# Patient Record
Sex: Male | Born: 1985 | Race: White | Hispanic: No | Marital: Married | Smoking: Current every day smoker
Health system: Southern US, Community
[De-identification: ages and names within clinical notes are randomized; demographics above are authoritative.]

---
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
## 2010-07-01 ENCOUNTER — Emergency Department (HOSPITAL_COMMUNITY)
Admission: EM | Admit: 2010-07-01 | Discharge: 2010-07-01 | Payer: Self-pay | Source: Home / Self Care | Admitting: Emergency Medicine

## 2010-07-23 ENCOUNTER — Emergency Department (HOSPITAL_COMMUNITY): Admission: EM | Admit: 2010-07-23 | Discharge: 2010-07-23 | Payer: Self-pay | Admitting: Emergency Medicine

## 2011-01-09 LAB — URINALYSIS, ROUTINE W REFLEX MICROSCOPIC
Bilirubin Urine: NEGATIVE
Glucose, UA: NEGATIVE mg/dL
Hgb urine dipstick: NEGATIVE
Ketones, ur: NEGATIVE mg/dL
Nitrite: NEGATIVE
Protein, ur: NEGATIVE mg/dL
Specific Gravity, Urine: 1.007 (ref 1.005–1.030)
Urobilinogen, UA: 0.2 mg/dL (ref 0.0–1.0)
pH: 5.5 (ref 5.0–8.0)

## 2011-01-09 LAB — RAPID URINE DRUG SCREEN, HOSP PERFORMED
Amphetamines: NOT DETECTED
Barbiturates: NOT DETECTED
Benzodiazepines: NOT DETECTED
Cocaine: NOT DETECTED
Opiates: NOT DETECTED
Tetrahydrocannabinol: NOT DETECTED

## 2011-01-09 LAB — CBC
MCH: 32.1 pg (ref 26.0–34.0)
MCHC: 34.8 g/dL (ref 30.0–36.0)
Platelets: 208 10*3/uL (ref 150–400)
RDW: 12.5 % (ref 11.5–15.5)

## 2011-01-09 LAB — DIFFERENTIAL
Basophils Absolute: 0 10*3/uL (ref 0.0–0.1)
Basophils Relative: 0 % (ref 0–1)
Eosinophils Absolute: 0 10*3/uL (ref 0.0–0.7)
Monocytes Relative: 5 % (ref 3–12)
Neutro Abs: 11.5 10*3/uL — ABNORMAL HIGH (ref 1.7–7.7)
Neutrophils Relative %: 86 % — ABNORMAL HIGH (ref 43–77)

## 2011-01-09 LAB — POCT I-STAT, CHEM 8
Calcium, Ion: 1.08 mmol/L — ABNORMAL LOW (ref 1.12–1.32)
Chloride: 97 mEq/L (ref 96–112)
Glucose, Bld: 92 mg/dL (ref 70–99)
HCT: 40 % (ref 39.0–52.0)
TCO2: 22 mmol/L (ref 0–100)

## 2011-01-09 LAB — GLUCOSE, CAPILLARY: Glucose-Capillary: 99 mg/dL (ref 70–99)

## 2011-01-09 LAB — ETHANOL: Alcohol, Ethyl (B): <5 mg/dL (ref 0–10)

## 2011-01-09 LAB — PROTIME-INR
INR: 1.09 (ref 0.00–1.49)
Prothrombin Time: 14.3 seconds (ref 11.6–15.2)

## 2011-01-21 ENCOUNTER — Emergency Department (HOSPITAL_COMMUNITY)
Admission: EM | Admit: 2011-01-21 | Discharge: 2011-01-21 | Disposition: A | Discharge status: Home / Self Care | Payer: BC Managed Care – PPO | Attending: Emergency Medicine | Admitting: Emergency Medicine

## 2011-01-21 DIAGNOSIS — Z202 Contact with and (suspected) exposure to infections with a predominantly sexual mode of transmission: Secondary | ICD-10-CM | POA: Insufficient documentation
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
## 2013-02-21 ENCOUNTER — Ambulatory Visit: Payer: Self-pay

## 2016-12-09 DIAGNOSIS — J111 Influenza due to unidentified influenza virus with other respiratory manifestations: Secondary | ICD-10-CM | POA: Diagnosis not present

## 2016-12-09 DIAGNOSIS — F17200 Nicotine dependence, unspecified, uncomplicated: Secondary | ICD-10-CM | POA: Diagnosis not present

## 2018-04-21 ENCOUNTER — Emergency Department (HOSPITAL_COMMUNITY): Payer: BLUE CROSS/BLUE SHIELD

## 2018-04-21 ENCOUNTER — Encounter (HOSPITAL_COMMUNITY): Payer: Self-pay

## 2018-04-21 ENCOUNTER — Emergency Department (HOSPITAL_COMMUNITY)
Admission: EM | Admit: 2018-04-21 | Discharge: 2018-04-21 | Disposition: A | Discharge status: Home / Self Care | Payer: BLUE CROSS/BLUE SHIELD | Attending: Emergency Medicine | Admitting: Emergency Medicine

## 2018-04-21 DIAGNOSIS — F17200 Nicotine dependence, unspecified, uncomplicated: Secondary | ICD-10-CM | POA: Diagnosis not present

## 2018-04-21 DIAGNOSIS — N201 Calculus of ureter: Secondary | ICD-10-CM | POA: Insufficient documentation

## 2018-04-21 DIAGNOSIS — R109 Unspecified abdominal pain: Secondary | ICD-10-CM

## 2018-04-21 DIAGNOSIS — R1012 Left upper quadrant pain: Secondary | ICD-10-CM | POA: Diagnosis present

## 2018-04-21 LAB — CBC WITH DIFFERENTIAL/PLATELET
Basophils Absolute: 0 10*3/uL (ref 0.0–0.1)
Basophils Relative: 0 %
Eosinophils Absolute: 0.1 10*3/uL (ref 0.0–0.7)
Eosinophils Relative: 1 %
HEMATOCRIT: 43.1 % (ref 39.0–52.0)
HEMOGLOBIN: 14.9 g/dL (ref 13.0–17.0)
LYMPHS PCT: 34 %
Lymphs Abs: 3 10*3/uL (ref 0.7–4.0)
MCH: 32.2 pg (ref 26.0–34.0)
MCHC: 34.6 g/dL (ref 30.0–36.0)
MCV: 93.1 fL (ref 78.0–100.0)
MONOS PCT: 7 %
Monocytes Absolute: 0.6 10*3/uL (ref 0.1–1.0)
NEUTROS ABS: 5.2 10*3/uL (ref 1.7–7.7)
NEUTROS PCT: 58 %
Platelets: 284 10*3/uL (ref 150–400)
RBC: 4.63 MIL/uL (ref 4.22–5.81)
RDW: 12.9 % (ref 11.5–15.5)
WBC: 8.8 10*3/uL (ref 4.0–10.5)

## 2018-04-21 LAB — COMPREHENSIVE METABOLIC PANEL
ALK PHOS: 68 U/L (ref 38–126)
ALT: 15 U/L (ref 0–44)
ANION GAP: 12 (ref 5–15)
AST: 20 U/L (ref 15–41)
Albumin: 4.5 g/dL (ref 3.5–5.0)
BILIRUBIN TOTAL: 0.2 mg/dL — AB (ref 0.3–1.2)
BUN: 19 mg/dL (ref 6–20)
CALCIUM: 9.3 mg/dL (ref 8.9–10.3)
CO2: 22 mmol/L (ref 22–32)
CREATININE: 1.1 mg/dL (ref 0.61–1.24)
Chloride: 105 mmol/L (ref 98–111)
GFR calc non Af Amer: >60 mL/min (ref >60)
GLUCOSE: 127 mg/dL — AB (ref 70–99)
Potassium: 3.4 mmol/L — ABNORMAL LOW (ref 3.5–5.1)
Sodium: 139 mmol/L (ref 135–145)
TOTAL PROTEIN: 7.7 g/dL (ref 6.5–8.1)

## 2018-04-21 LAB — URINALYSIS, ROUTINE W REFLEX MICROSCOPIC
BACTERIA UA: NONE SEEN
BILIRUBIN URINE: NEGATIVE
Glucose, UA: NEGATIVE mg/dL
Ketones, ur: NEGATIVE mg/dL
Leukocytes, UA: NEGATIVE
NITRITE: NEGATIVE
PH: 6 (ref 5.0–8.0)
Protein, ur: NEGATIVE mg/dL
Specific Gravity, Urine: 1.018 (ref 1.005–1.030)

## 2018-04-21 LAB — LIPASE, BLOOD: Lipase: 27 U/L (ref 11–51)

## 2018-04-21 MED ORDER — TAMSULOSIN HCL 0.4 MG PO CAPS: 0.4000 mg | ORAL_CAPSULE | Freq: Every day | ORAL | 0 refills | Status: AC

## 2018-04-21 MED ORDER — IBUPROFEN 800 MG PO TABS: 800.0000 mg | ORAL_TABLET | Freq: Three times a day (TID) | ORAL | 0 refills | Status: AC | PRN

## 2018-04-21 MED ORDER — KETOROLAC TROMETHAMINE 30 MG/ML IJ SOLN
30.0000 mg | Freq: Once | INTRAMUSCULAR | Status: AC
  Administered 2018-04-21: 30 mg via INTRAVENOUS
  Filled 2018-04-21: qty 1

## 2018-04-21 MED ORDER — OXYCODONE-ACETAMINOPHEN 5-325 MG PO TABS: 1.0000 | ORAL_TABLET | ORAL | 0 refills | Status: DC | PRN

## 2018-04-21 MED ORDER — ONDANSETRON 4 MG PO TBDP: 4.0000 mg | ORAL_TABLET | Freq: Three times a day (TID) | ORAL | 0 refills | Status: AC | PRN

## 2018-04-21 MED ORDER — ONDANSETRON HCL 4 MG/2ML IJ SOLN
4.0000 mg | Freq: Once | INTRAMUSCULAR | Status: AC
  Administered 2018-04-21: 4 mg via INTRAVENOUS
  Filled 2018-04-21: qty 2

## 2018-04-21 NOTE — Discharge Instructions (Signed)
You have been seen in the Emergency Department (ED) today for pain that we believe based on your workup, is caused by kidney stones.  As we have discussed, please drink plenty of fluids.  Please make a follow up appointment with the physician(s) listed elsewhere in this documentation. ° °You may take pain medication as needed but ONLY as prescribed.  Please also take your prescribed Flomax daily.  We also recommend that you take over-the-counter ibuprofen regularly according to label instructions over the next 5 days.  Take it with meals to minimize stomach discomfort. ° °Please see your doctor as soon as possible as stones may take 1-3 weeks to pass and you may require additional care or medications. ° °Do not drink alcohol, drive or participate in any other potentially dangerous activities while taking opiate pain medication as it may make you sleepy. Do not take this medication with any other sedating medications, either prescription or over-the-counter. If you were prescribed Percocet or Vicodin, do not take these with acetaminophen (Tylenol) as it is already contained within these medications. °  °Take Percocet as needed for severe pain.  This medication is an opiate (or narcotic) pain medication and can be habit forming.  Use it as little as possible to achieve adequate pain control.  Do not use or use it with extreme caution if you have a history of opiate abuse or dependence.  If you are on a pain contract with your primary care doctor or a pain specialist, be sure to let them know you were prescribed this medication today from the Emergency Department.  This medication is intended for your use only - do not give any to anyone else and keep it in a secure place where nobody else, especially children, have access to it.  It will also cause or worsen constipation, so you may want to consider taking an over-the-counter stool softener while you are taking this medication. ° °Return to the Emergency Department  (ED) or call your doctor if you have any worsening pain, fever, painful urination, are unable to urinate, or develop other symptoms that concern you. ° ° ° °Kidney Stones °Kidney stones (urolithiasis) are deposits that form inside your kidneys. The intense pain is caused by the stone moving through the urinary tract. When the stone moves, the ureter goes into spasm around the stone. The stone is usually passed in the urine.  °CAUSES  °A disorder that makes certain neck glands produce too much parathyroid hormone (primary hyperparathyroidism). °A buildup of uric acid crystals, similar to gout in your joints. °Narrowing (stricture) of the ureter. °A kidney obstruction present at birth (congenital obstruction). °Previous surgery on the kidney or ureters. °Numerous kidney infections. °SYMPTOMS  °Feeling sick to your stomach (nauseous). °Throwing up (vomiting). °Blood in the urine (hematuria). °Pain that usually spreads (radiates) to the groin. °Frequency or urgency of urination. °DIAGNOSIS  °Taking a history and physical exam. °Blood or urine tests. °CT scan. °Occasionally, an examination of the inside of the urinary bladder (cystoscopy) is performed. °TREATMENT  °Observation. °Increasing your fluid intake. °Extracorporeal shock wave lithotripsy--This is a noninvasive procedure that uses shock waves to break up kidney stones. °Surgery may be needed if you have severe pain or persistent obstruction. There are various surgical procedures. Most of the procedures are performed with the use of small instruments. Only small incisions are needed to accommodate these instruments, so recovery time is minimized. °The size, location, and chemical composition are all important variables that will determine the proper   choice of action for you. Talk to your health care provider to better understand your situation so that you will minimize the risk of injury to yourself and your kidney.  °HOME CARE INSTRUCTIONS  °Drink enough water  and fluids to keep your urine clear or pale yellow. This will help you to pass the stone or stone fragments. °Strain all urine through the provided strainer. Keep all particulate matter and stones for your health care provider to see. The stone causing the pain may be as small as a grain of salt. It is very important to use the strainer each and every time you pass your urine. The collection of your stone will allow your health care provider to analyze it and verify that a stone has actually passed. The stone analysis will often identify what you can do to reduce the incidence of recurrences. °Only take over-the-counter or prescription medicines for pain, discomfort, or fever as directed by your health care provider. °Keep all follow-up visits as told by your health care provider. This is important. °Get follow-up X-rays if required. The absence of pain does not always mean that the stone has passed. It may have only stopped moving. If the urine remains completely obstructed, it can cause loss of kidney function or even complete destruction of the kidney. It is your responsibility to make sure X-rays and follow-ups are completed. Ultrasounds of the kidney can show blockages and the status of the kidney. Ultrasounds are not associated with any radiation and can be performed easily in a matter of minutes. °Make changes to your daily diet as told by your health care provider. You may be told to: °Limit the amount of salt that you eat. °Eat 5 or more servings of fruits and vegetables each day. °Limit the amount of meat, poultry, fish, and eggs that you eat. °Collect a 24-hour urine sample as told by your health care provider. You may need to collect another urine sample every 6-12 months. °SEEK MEDICAL CARE IF: °You experience pain that is progressive and unresponsive to any pain medicine you have been prescribed. °SEEK IMMEDIATE MEDICAL CARE IF:  °Pain cannot be controlled with the prescribed medicine. °You have a  fever or shaking chills. °The severity or intensity of pain increases over 18 hours and is not relieved by pain medicine. °You develop a new onset of abdominal pain. °You feel faint or pass out. °You are unable to urinate. °  °This information is not intended to replace advice given to you by your health care provider. Make sure you discuss any questions you have with your health care provider. °  °Document Released: 10/13/2005 Document Revised: 07/04/2015 Document Reviewed: 03/16/2013 °Elsevier Interactive Patient Education ©2016 [employer] ° ° °

## 2018-04-21 NOTE — ED Notes (Signed)
Family at bedside. 

## 2018-04-21 NOTE — ED Triage Notes (Signed)
Pt c/o left flank pain since approx 0615.  Reports nausea and has vomited x 2.

## 2018-04-21 NOTE — ED Notes (Signed)
Patient transported to CT 

## 2018-04-21 NOTE — ED Notes (Signed)
ED Provider at bedside. 

## 2018-04-21 NOTE — ED Provider Notes (Signed)
Emergency Department Provider Note   I have reviewed the triage vital signs and the nursing notes.   HISTORY  Chief Complaint Flank Pain   HPI Justin Wolfe is a 32 y.o. male with no significant PMH but current daily smoker presents to the emergency department for evaluation of sudden onset left flank pain at 6:15 AM.  The patient describes sudden onset, severe pain without radiation.  He had 2 episodes of vomiting and describes some shaking as a result of the pain.  He denies any chest pain or dyspnea.  No dysuria, hesitancy, urgency.  No gross hematuria.  No fevers or chills.  No similar symptoms in the past.  No medications prior to arrival.  History reviewed. No pertinent past medical history.  There are no active problems to display for this patient.   History reviewed. No pertinent surgical history.    Allergies Patient has no known allergies.  No family history on file.  Social History Social History   Tobacco Use  . Smoking status: Current Every Day Smoker  . Smokeless tobacco: Never Used  Substance Use Topics  . Alcohol use: Never    Frequency: Never  . Drug use: Never    Review of Systems  Constitutional: No fever/chills Eyes: No visual changes. ENT: No sore throat. Cardiovascular: Denies chest pain. Respiratory: Denies shortness of breath. Gastrointestinal: No abdominal pain. Positive nausea and vomiting.  No diarrhea.  No constipation. Genitourinary: Negative for dysuria. Positive left flank pain.  Musculoskeletal: Negative for back pain. Skin: Negative for rash. Neurological: Negative for headaches, focal weakness or numbness.  10-point ROS otherwise negative.  ____________________________________________   PHYSICAL EXAM:  VITAL SIGNS: ED Triage Vitals  Enc Vitals Group     BP 04/21/18 0735 (!) 150/114     Pulse Rate 04/21/18 0735 94     Resp 04/21/18 0735 18     Temp 04/21/18 0735 98.2 F (36.8 C)     Temp Source 04/21/18 0735  Oral     SpO2 04/21/18 0735 100 %     Weight 04/21/18 0740 120 lb (54.4 kg)     Height 04/21/18 0740 5\' 8"  (1.727 m)     Pain Score 04/21/18 0739 9   Constitutional: Alert and oriented. Appears uncomfortable, grimacing at times, but able to provide a full history.  Eyes: Conjunctivae are normal. Head: Atraumatic. Nose: No congestion/rhinnorhea. Mouth/Throat: Mucous membranes are moist.  Neck: No stridor.  Cardiovascular: Normal rate, regular rhythm. Good peripheral circulation. Grossly normal heart sounds.   Respiratory: Normal respiratory effort.  No retractions. Lungs CTAB. Gastrointestinal: Soft and nontender. No distention. Positive mild left CVA tenderness.  Musculoskeletal: No lower extremity tenderness nor edema. No gross deformities of extremities. Neurologic:  Normal speech and language. No gross focal neurologic deficits are appreciated.  Skin:  Skin is warm, dry and intact. No rash noted.  ____________________________________________   LABS (all labs ordered are listed, but only abnormal results are displayed)  Labs Reviewed  URINALYSIS, ROUTINE W REFLEX MICROSCOPIC - Abnormal; Notable for the following components:      Result Value   Hgb urine dipstick LARGE (*)    All other components within normal limits  COMPREHENSIVE METABOLIC PANEL - Abnormal; Notable for the following components:   Potassium 3.4 (*)    Glucose, Bld 127 (*)    Total Bilirubin 0.2 (*)    All other components within normal limits  CBC WITH DIFFERENTIAL/PLATELET  LIPASE, BLOOD   ____________________________________________  RADIOLOGY  Ct  Renal Stone Study  Result Date: 04/21/2018 CLINICAL DATA:  Left flank pain today EXAM: CT ABDOMEN AND PELVIS WITHOUT CONTRAST TECHNIQUE: Multidetector CT imaging of the abdomen and pelvis was performed following the standard protocol without IV contrast. COMPARISON:  None. FINDINGS: Lower chest: No acute abnormality. Hepatobiliary: Unremarkable liver and  gallbladder. Pancreas: Unremarkable Spleen: Unremarkable Adrenals/Urinary Tract: No hydronephrosis. No renal calculus. There is a 2 mm calculus at the left ureterovesical junction. Bladder is otherwise. Stomach/Bowel: Normal appendix. No evidence of small or large bowel obstruction. No obvious mass in the colon. Stomach is decompressed. Vascular/Lymphatic: Small scattered para-aortic lymph nodes. No abnormal adenopathy by measurement criteria. No evidence of aortic aneurysm. Reproductive: Normal prostate. Other: No free fluid. Musculoskeletal: No vertebral compression. IMPRESSION: 2 mm calculus at the left ureterovesical junction without hydronephrosis. Electronically Signed   By: Jolaine ClickArthur  Hoss M.D.   On: 04/21/2018 09:14    ____________________________________________   PROCEDURES  Procedure(s) performed:   Procedures  None ____________________________________________   INITIAL IMPRESSION / ASSESSMENT AND PLAN / ED COURSE  Pertinent labs & imaging results that were available during my care of the patient were reviewed by me and considered in my medical decision making (see chart for details).  Patient presents to the emergency department for evaluation of left flank pain starting at 6:15 AM.  He had 2 episodes of vomiting.  My suspicion for ureterolithiasis is elevated given the quality of pain and sudden onset.  Patient has no prior history of this, however.  Extremely low suspicion for vascular disease such as dissection or aneurysm given the patient's age, although he is a smoker. Normal vascular exam peripherally. Plan for CT renal, Toradol/Zofran, and labs.   09:45 AM CT scan reviewed which shows a 2 mm stone at the left UVJ.  This correlates with the patient's clinical symptoms and exam.  No hydronephrosis.  UA evaluated with no concern for infection.  Labs show normal kidney function.  Patient's pain is well controlled with Toradol and Zofran here.  Plan for discharge home with urology  follow-up.  Provided additional pain and nausea medication for home use along with Flomax.  Discussed return precautions for worsening pain and/or infection symptoms.   At this time, I do not feel there is any life-threatening condition present. I have reviewed and discussed all results (EKG, imaging, lab, urine as appropriate), exam findings with patient. I have reviewed nursing notes and appropriate previous records.  I feel the patient is safe to be discharged home without further emergent workup. Discussed usual and customary return precautions. Patient and family (if present) verbalize understanding and are comfortable with this plan.  Patient will follow-up with their primary care provider. If they do not have a primary care provider, information for follow-up has been provided to them. All questions have been answered.  ____________________________________________  FINAL CLINICAL IMPRESSION(S) / ED DIAGNOSES  Final diagnoses:  Ureterolithiasis  Left flank pain     MEDICATIONS GIVEN DURING THIS VISIT:  Medications  ketorolac (TORADOL) 30 MG/ML injection 30 mg (30 mg Intravenous Given 04/21/18 0755)  ondansetron (ZOFRAN) injection 4 mg (4 mg Intravenous Given 04/21/18 0755)     NEW OUTPATIENT MEDICATIONS STARTED DURING THIS VISIT:  New Prescriptions   IBUPROFEN (ADVIL,MOTRIN) 800 MG TABLET    Take 1 tablet (800 mg total) by mouth every 8 (eight) hours as needed.   ONDANSETRON (ZOFRAN ODT) 4 MG DISINTEGRATING TABLET    Take 1 tablet (4 mg total) by mouth every 8 (eight) hours as  needed for nausea or vomiting.   OXYCODONE-ACETAMINOPHEN (PERCOCET/ROXICET) 5-325 MG TABLET    Take 1 tablet by mouth every 4 (four) hours as needed for severe pain.   TAMSULOSIN (FLOMAX) 0.4 MG CAPS CAPSULE    Take 1 capsule (0.4 mg total) by mouth daily for 14 days.    Note:  This document was prepared using Dragon voice recognition software and may include unintentional dictation errors.  Alona Bene,  MD Emergency Medicine     Verlin Duke, Arlyss Repress, MD 04/21/18 [phone]

## 2018-07-29 DIAGNOSIS — D225 Melanocytic nevi of trunk: Secondary | ICD-10-CM | POA: Diagnosis not present

## 2018-07-29 DIAGNOSIS — Z1283 Encounter for screening for malignant neoplasm of skin: Secondary | ICD-10-CM | POA: Diagnosis not present

## 2018-07-29 DIAGNOSIS — D224 Melanocytic nevi of scalp and neck: Secondary | ICD-10-CM | POA: Diagnosis not present

## 2018-07-29 DIAGNOSIS — D485 Neoplasm of uncertain behavior of skin: Secondary | ICD-10-CM | POA: Diagnosis not present

## 2018-11-13 IMAGING — CT CT RENAL STONE PROTOCOL
2 of 4 series · 16 of 46 positions shown, 18 images · non-contrast
Comparison: None.

CLINICAL DATA: Left flank pain today

EXAM:
CT ABDOMEN AND PELVIS WITHOUT CONTRAST
TECHNIQUE: Multidetector CT imaging of the abdomen and pelvis was performed
following the standard protocol without IV contrast.

[Series 2: axial st · axial · 0.58mm/px · z∈[+881,+1266]mm · 13 of 85 slices shown, 15 images]
[im 4/85  soft-tissue]
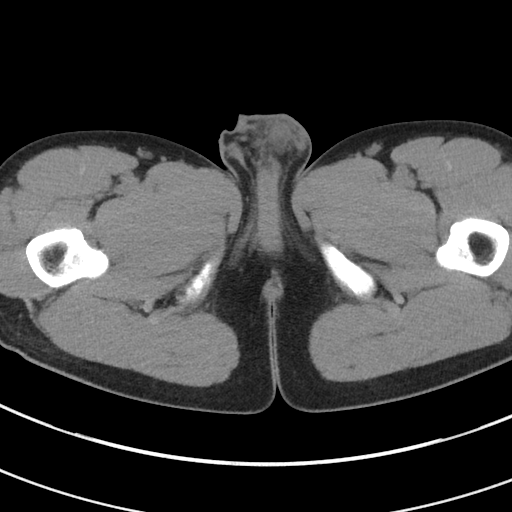
[im 4/85  bone]
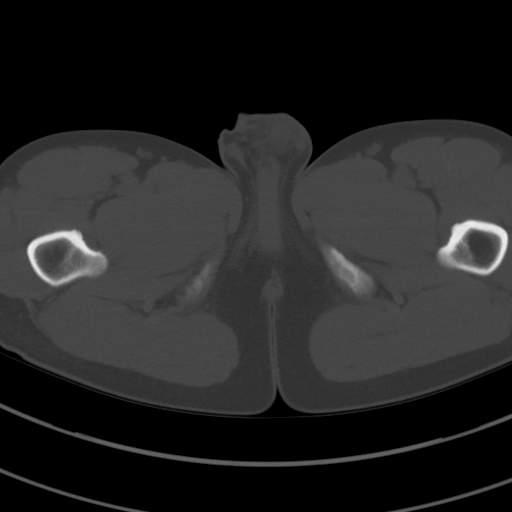
[im 11/85  soft-tissue]
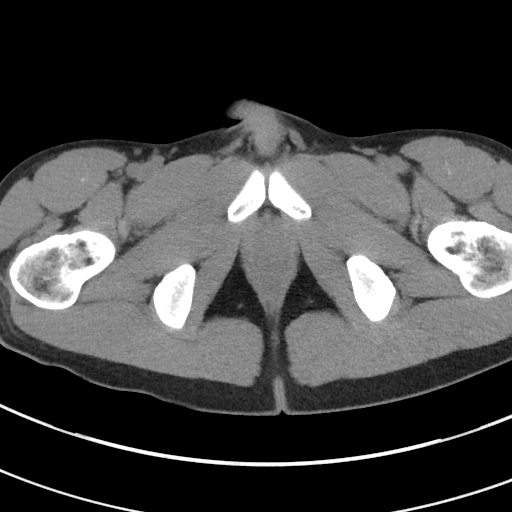
[im 17/85  soft-tissue]
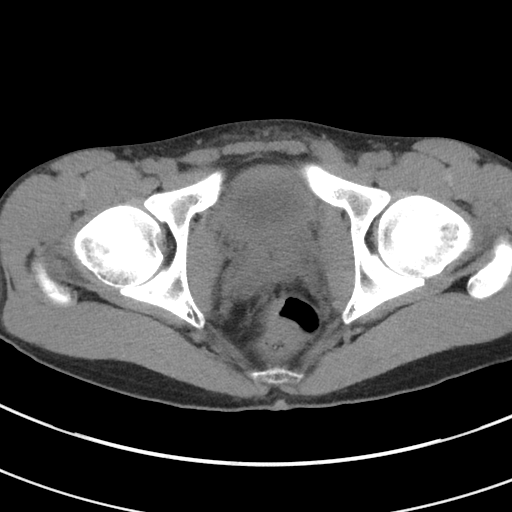
[im 24/85  soft-tissue]
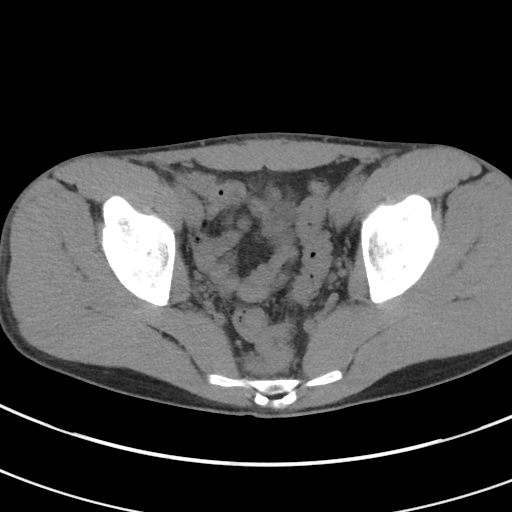
[im 31/85  soft-tissue]
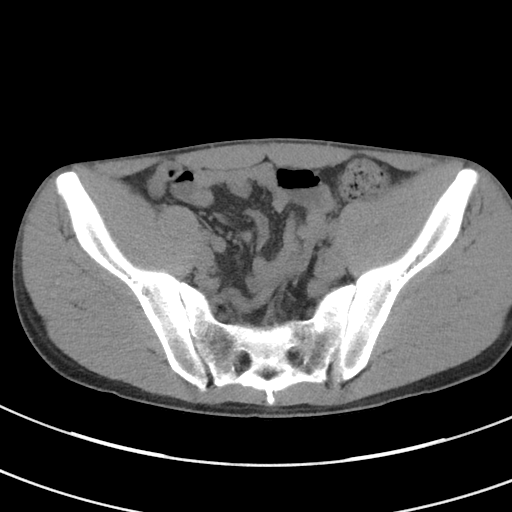
[im 37/85  soft-tissue]
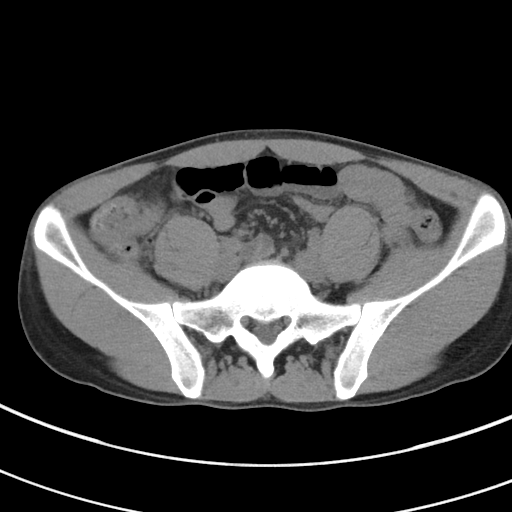
[im 44/85  soft-tissue]
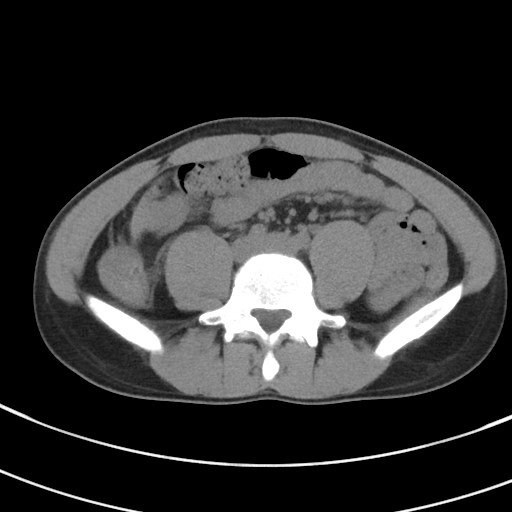
[im 48/85  soft-tissue]
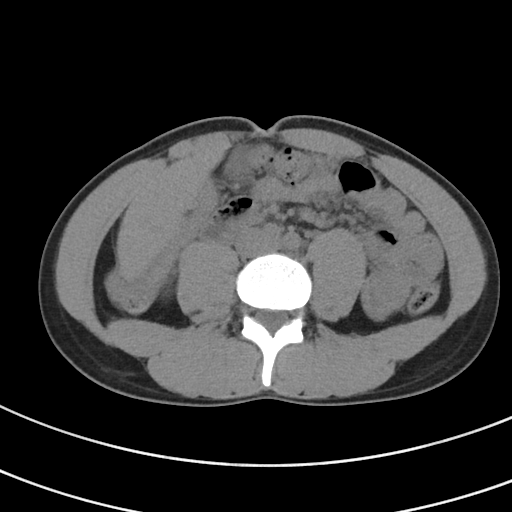
[im 54/85  soft-tissue]
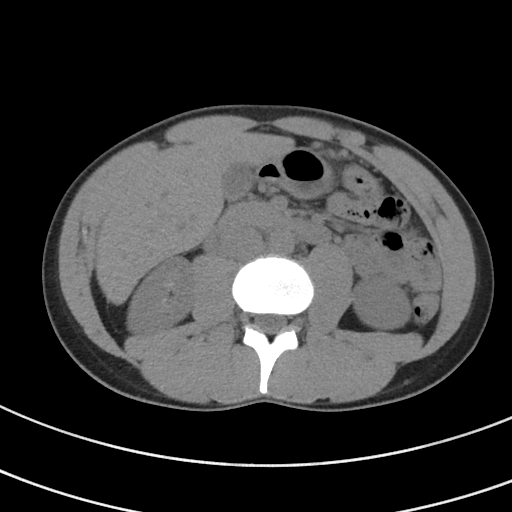
[im 54/85  bone]
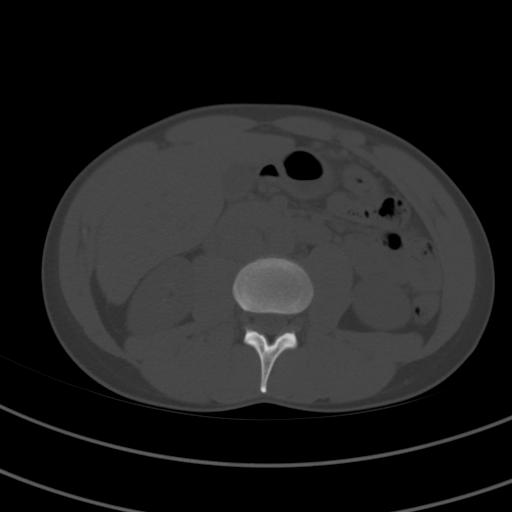
[im 61/85  soft-tissue]
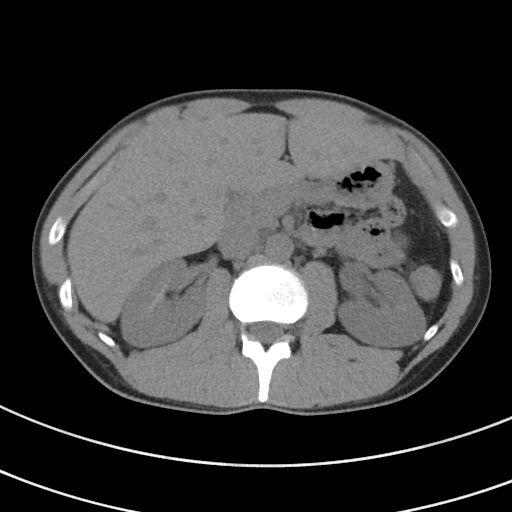
[im 68/85  soft-tissue]
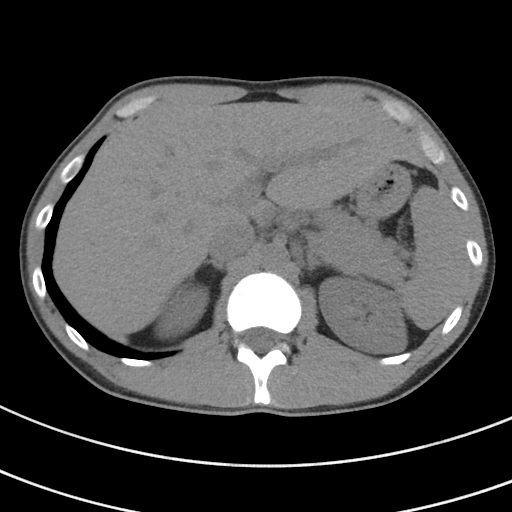
[im 74/85  soft-tissue]
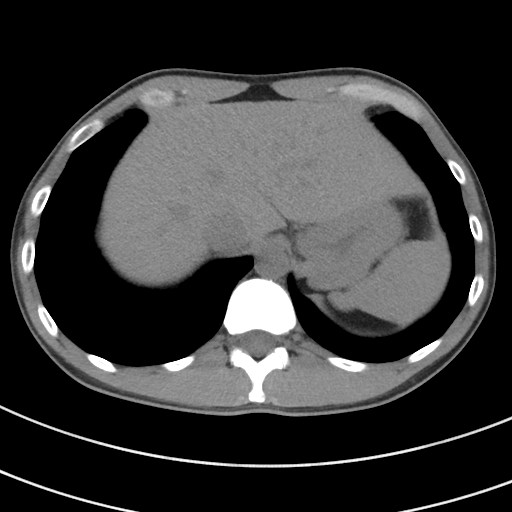
[im 81/85  soft-tissue]
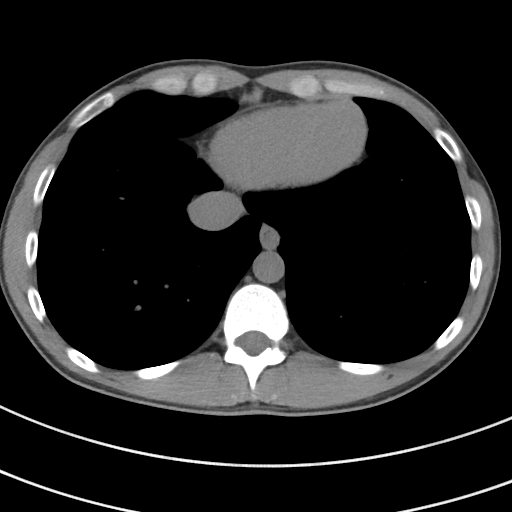

[Series 5: coronal st · coronal · 0.53mm/px · 3 of 62 slices shown]
[im 21/62  soft-tissue]
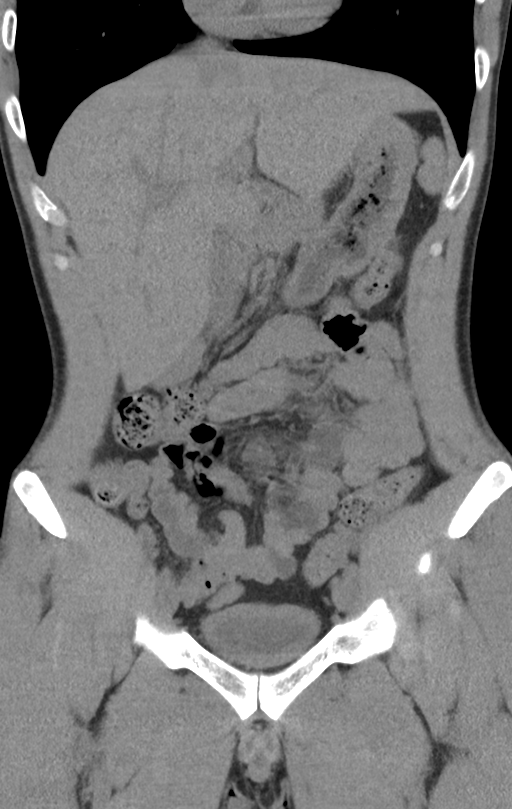
[im 28/62  soft-tissue]
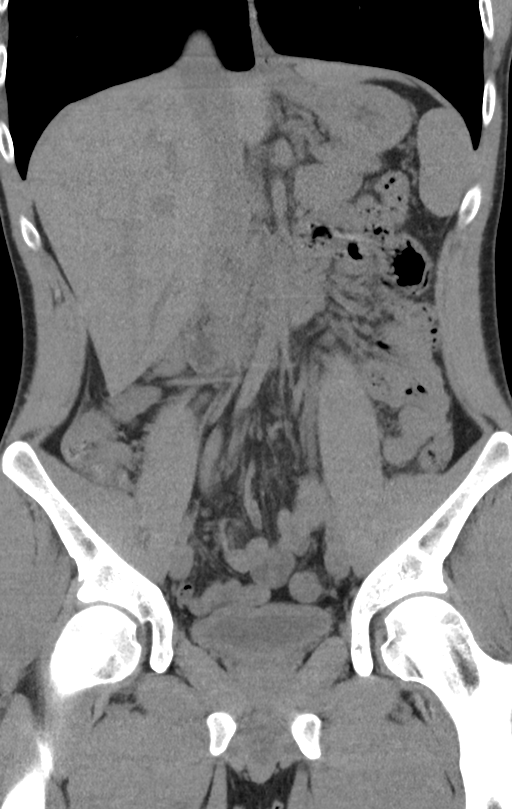
[im 34/62  soft-tissue]
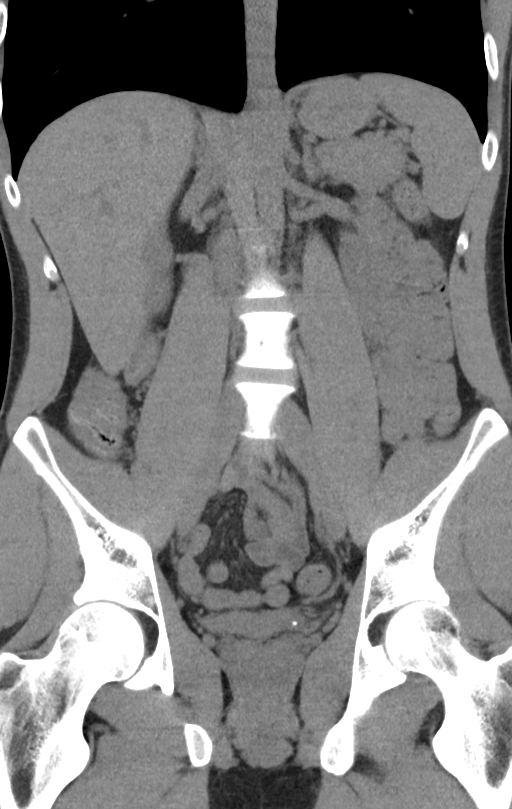

[16 of 46 positions shown; findings below may reference images not displayed]

FINDINGS: Lower chest: No acute abnormality.

Hepatobiliary: Unremarkable liver and gallbladder.

Pancreas: Unremarkable

Spleen: Unremarkable

Adrenals/Urinary Tract: No hydronephrosis. No renal calculus. There
is a 2 mm calculus at the left ureterovesical junction. Bladder is
otherwise.

Stomach/Bowel: Normal appendix. No evidence of small or large bowel
obstruction. No obvious mass in the colon. Stomach is decompressed.

Vascular/Lymphatic: Small scattered para-aortic lymph nodes. No
abnormal adenopathy by measurement criteria. No evidence of aortic
aneurysm.

Reproductive: Normal prostate.

Other: No free fluid.

Musculoskeletal: No vertebral compression.
IMPRESSION: 2 mm calculus at the left ureterovesical junction without
hydronephrosis.

## 2019-07-25 DIAGNOSIS — Z23 Encounter for immunization: Secondary | ICD-10-CM | POA: Diagnosis not present

## 2019-07-29 DIAGNOSIS — Z00110 Health examination for newborn under 8 days old: Secondary | ICD-10-CM | POA: Diagnosis not present

## 2019-08-05 DIAGNOSIS — J069 Acute upper respiratory infection, unspecified: Secondary | ICD-10-CM | POA: Diagnosis not present

## 2019-08-08 DIAGNOSIS — Z00111 Health examination for newborn 8 to 28 days old: Secondary | ICD-10-CM | POA: Diagnosis not present

## 2020-11-01 ENCOUNTER — Other Ambulatory Visit: Payer: BLUE CROSS/BLUE SHIELD

## 2021-10-15 DIAGNOSIS — J069 Acute upper respiratory infection, unspecified: Secondary | ICD-10-CM | POA: Diagnosis not present

## 2022-07-10 ENCOUNTER — Other Ambulatory Visit: Payer: Self-pay

## 2022-07-10 ENCOUNTER — Emergency Department (HOSPITAL_COMMUNITY): Payer: BC Managed Care – PPO

## 2022-07-10 ENCOUNTER — Emergency Department (HOSPITAL_COMMUNITY)
Admission: EM | Admit: 2022-07-10 | Discharge: 2022-07-10 | Disposition: A | Discharge status: Home / Self Care | Payer: BC Managed Care – PPO | Attending: Emergency Medicine | Admitting: Emergency Medicine

## 2022-07-10 ENCOUNTER — Encounter (HOSPITAL_COMMUNITY): Payer: Self-pay

## 2022-07-10 DIAGNOSIS — L03116 Cellulitis of left lower limb: Secondary | ICD-10-CM | POA: Insufficient documentation

## 2022-07-10 DIAGNOSIS — Z23 Encounter for immunization: Secondary | ICD-10-CM | POA: Insufficient documentation

## 2022-07-10 DIAGNOSIS — S91332A Puncture wound without foreign body, left foot, initial encounter: Secondary | ICD-10-CM | POA: Diagnosis not present

## 2022-07-10 MED ORDER — SULFAMETHOXAZOLE-TRIMETHOPRIM 800-160 MG PO TABS: 1.0000 | ORAL_TABLET | Freq: Two times a day (BID) | ORAL | 0 refills | Status: DC

## 2022-07-10 MED ORDER — TETANUS-DIPHTH-ACELL PERTUSSIS 5-2.5-18.5 LF-MCG/0.5 IM SUSY
0.5000 mL | PREFILLED_SYRINGE | Freq: Once | INTRAMUSCULAR | Status: AC
  Administered 2022-07-10: 0.5 mL via INTRAMUSCULAR
  Filled 2022-07-10: qty 0.5

## 2022-07-10 MED ORDER — SULFAMETHOXAZOLE-TRIMETHOPRIM 800-160 MG PO TABS
1.0000 | ORAL_TABLET | Freq: Once | ORAL | Status: AC
  Administered 2022-07-10: 1 via ORAL
  Filled 2022-07-10: qty 1

## 2022-07-10 NOTE — Discharge Instructions (Addendum)
Soak foot 20 minutes 4 times a day.  Return if symptoms worsen or change.  

## 2022-07-10 NOTE — ED Triage Notes (Signed)
Pt c/o left foot pain and swelling, states he stepped on a piece of wire that went through the sole of his shoe two days ago.

## 2022-07-13 NOTE — ED Provider Notes (Signed)
[facility] Provider Note   CSN: [id number] Arrival date & time: 07/10/22  1642     History  Chief Complaint  Patient presents with   Foot Pain    Justin Wolfe is a 36 y.o. male.  Pt reports he stepped on a piece of wire 2 days ago.  Pt reports he now has redness around the area and to the top of his foot.  Pt denies any fever or chills.     The history is provided by the patient. No language interpreter was used.  Foot Pain This is a new problem. The current episode started 2 days ago. The problem occurs constantly. The problem has been gradually worsening. Nothing aggravates the symptoms. Nothing relieves the symptoms. He has tried nothing for the symptoms. The treatment provided no relief.       Home Medications Prior to Admission medications   Medication Sig Start Date End Date Taking? Authorizing Provider  sulfamethoxazole-trimethoprim (BACTRIM DS) 800-160 MG tablet Take 1 tablet by mouth 2 (two) times daily. 07/10/22  Yes Caryl Ada K, PA-C  ibuprofen (ADVIL,MOTRIN) 800 MG tablet Take 1 tablet (800 mg total) by mouth every 8 (eight) hours as needed. 04/21/18   Long, Wonda Olds, MD  ondansetron (ZOFRAN ODT) 4 MG disintegrating tablet Take 1 tablet (4 mg total) by mouth every 8 (eight) hours as needed for nausea or vomiting. 04/21/18   Long, Wonda Olds, MD  oxyCODONE-acetaminophen (PERCOCET/ROXICET) 5-325 MG tablet Take 1 tablet by mouth every 4 (four) hours as needed for severe pain. 04/21/18   Long, Wonda Olds, MD      Allergies    Patient has no known allergies.    Review of Systems   Review of Systems  Skin:  Negative for wound.  All other systems reviewed and are negative.   Physical Exam Updated Vital Signs BP 110/78   Pulse 82   Temp 99 F (37.2 C)   Resp 16   Ht 5\' 8"  (1.727 m)   Wt 61.2 kg   SpO2 99%   BMI 20.53 kg/m  Physical Exam Vitals and nursing note reviewed.  Constitutional:      Appearance: He is well-developed.   HENT:     Head: Normocephalic.  Cardiovascular:     Rate and Rhythm: Normal rate.  Pulmonary:     Effort: Pulmonary effort is normal.  Abdominal:     General: There is no distension.  Musculoskeletal:        General: Swelling and tenderness present.     Cervical back: Normal range of motion.     Comments: Swollen left foot  plantar area and around to top of foot,  from  nv and ns intact   Skin:    General: Skin is warm.  Neurological:     General: No focal deficit present.     Mental Status: He is alert and oriented to person, place, and time.     ED Results / Procedures / Treatments   Labs (all labs ordered are listed, but only abnormal results are displayed) Labs Reviewed - No data to display  EKG None  Radiology No results found.  Procedures Procedures    Medications Ordered in ED Medications  Tdap (BOOSTRIX) injection 0.5 mL (0.5 mLs Intramuscular Given 07/10/22 2112)  sulfamethoxazole-trimethoprim (BACTRIM DS) 800-160 MG per tablet 1 tablet (1 tablet Oral Given 07/10/22 2111)    ED Course/ Medical Decision Making/ A&P  Medical Decision Making Pt stepped on a piece of wire, now foot is red   Amount and/or Complexity of Data Reviewed Independent Historian: spouse    Details: Pt is here with wife who is supportive  Radiology: ordered and independent interpretation performed. Decision-making details documented in ED Course.    Details: Xray  left foot  no fractue   Risk Prescription drug management. Risk Details: I counseled pt on puncture wound and early cellulitis.  Pt advised to soak his foot 20 minutes 4 times a day.  Pt given an rx for bactrim  Pt advised to recheck if not improving in 2 days            Final Clinical Impression(s) / ED Diagnoses Final diagnoses:  Cellulitis of left foot    Rx / DC Orders ED Discharge Orders          Ordered    sulfamethoxazole-trimethoprim (BACTRIM DS) 800-160 MG tablet  2  times daily        07/10/22 2134           An After Visit Summary was printed and given to the patient.    Elson Areas, [place] 07/13/22 1737    Rondel Baton, MD 07/14/22 [phone]

## 2022-09-03 DIAGNOSIS — F17210 Nicotine dependence, cigarettes, uncomplicated: Secondary | ICD-10-CM | POA: Diagnosis not present

## 2022-09-03 DIAGNOSIS — J029 Acute pharyngitis, unspecified: Secondary | ICD-10-CM | POA: Diagnosis not present

## 2022-09-03 DIAGNOSIS — Z0001 Encounter for general adult medical examination with abnormal findings: Secondary | ICD-10-CM | POA: Diagnosis not present

## 2022-12-22 DIAGNOSIS — D225 Melanocytic nevi of trunk: Secondary | ICD-10-CM | POA: Diagnosis not present

## 2022-12-22 DIAGNOSIS — D485 Neoplasm of uncertain behavior of skin: Secondary | ICD-10-CM | POA: Diagnosis not present

## 2022-12-22 DIAGNOSIS — D2262 Melanocytic nevi of left upper limb, including shoulder: Secondary | ICD-10-CM | POA: Diagnosis not present

## 2022-12-22 DIAGNOSIS — Z1283 Encounter for screening for malignant neoplasm of skin: Secondary | ICD-10-CM | POA: Diagnosis not present

## 2022-12-22 DIAGNOSIS — D034 Melanoma in situ of scalp and neck: Secondary | ICD-10-CM | POA: Diagnosis not present

## 2022-12-22 DIAGNOSIS — L905 Scar conditions and fibrosis of skin: Secondary | ICD-10-CM | POA: Diagnosis not present

## 2023-01-06 DIAGNOSIS — D225 Melanocytic nevi of trunk: Secondary | ICD-10-CM | POA: Diagnosis not present

## 2023-01-06 DIAGNOSIS — D485 Neoplasm of uncertain behavior of skin: Secondary | ICD-10-CM | POA: Diagnosis not present

## 2023-01-06 DIAGNOSIS — L905 Scar conditions and fibrosis of skin: Secondary | ICD-10-CM | POA: Diagnosis not present

## 2023-01-06 DIAGNOSIS — D0362 Melanoma in situ of left upper limb, including shoulder: Secondary | ICD-10-CM | POA: Diagnosis not present

## 2023-01-06 DIAGNOSIS — D034 Melanoma in situ of scalp and neck: Secondary | ICD-10-CM | POA: Diagnosis not present

## 2023-01-21 DIAGNOSIS — D0362 Melanoma in situ of left upper limb, including shoulder: Secondary | ICD-10-CM | POA: Diagnosis not present

## 2023-01-21 DIAGNOSIS — L988 Other specified disorders of the skin and subcutaneous tissue: Secondary | ICD-10-CM | POA: Diagnosis not present

## 2023-01-21 DIAGNOSIS — D034 Melanoma in situ of scalp and neck: Secondary | ICD-10-CM | POA: Diagnosis not present

## 2023-01-21 DIAGNOSIS — L089 Local infection of the skin and subcutaneous tissue, unspecified: Secondary | ICD-10-CM | POA: Diagnosis not present

## 2023-01-21 DIAGNOSIS — D485 Neoplasm of uncertain behavior of skin: Secondary | ICD-10-CM | POA: Diagnosis not present

## 2023-02-06 DIAGNOSIS — D034 Melanoma in situ of scalp and neck: Secondary | ICD-10-CM | POA: Diagnosis not present

## 2023-11-12 DIAGNOSIS — F17200 Nicotine dependence, unspecified, uncomplicated: Secondary | ICD-10-CM | POA: Diagnosis not present

## 2023-11-12 DIAGNOSIS — F17210 Nicotine dependence, cigarettes, uncomplicated: Secondary | ICD-10-CM | POA: Diagnosis not present

## 2023-11-12 DIAGNOSIS — J069 Acute upper respiratory infection, unspecified: Secondary | ICD-10-CM | POA: Diagnosis not present

## 2023-11-12 DIAGNOSIS — J029 Acute pharyngitis, unspecified: Secondary | ICD-10-CM | POA: Diagnosis not present

## 2024-04-20 ENCOUNTER — Ambulatory Visit
Admission: RE | Admit: 2024-04-20 | Discharge: 2024-04-20 | Disposition: A | Discharge status: Home / Self Care | Source: Ambulatory Visit | Attending: Nurse Practitioner | Admitting: Nurse Practitioner

## 2024-04-20 VITALS — BP 118/65 | HR 86 | Temp 98.5°F | Resp 16

## 2024-04-20 DIAGNOSIS — T24202A Burn of second degree of unspecified site of left lower limb, except ankle and foot, initial encounter: Secondary | ICD-10-CM

## 2024-04-20 DIAGNOSIS — T24291A Burn of second degree of multiple sites of right lower limb, except ankle and foot, initial encounter: Secondary | ICD-10-CM | POA: Diagnosis not present

## 2024-04-20 MED ORDER — MUPIROCIN 2 % EX OINT: 1.0000 | TOPICAL_OINTMENT | Freq: Two times a day (BID) | CUTANEOUS | 0 refills | Status: AC

## 2024-04-20 MED ORDER — SULFAMETHOXAZOLE-TRIMETHOPRIM 800-160 MG PO TABS: 1.0000 | ORAL_TABLET | Freq: Two times a day (BID) | ORAL | 0 refills | Status: AC

## 2024-04-20 MED ORDER — CHLORHEXIDINE GLUCONATE 4 % EX SOLN: Freq: Every day | CUTANEOUS | 0 refills | Status: AC | PRN
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
## 2024-04-20 NOTE — Discharge Instructions (Addendum)
 Keep the dressing in place for the remainder of the evening, you may change the dressing tomorrow. Cleanse the area twice daily as directed. Apply medication as prescribed. Keep the areas covered when you are out in public or working.  You may leave the areas open to air when you are home.  Do not allow the areas to come into close contact with pets if you have them in the home. Continue to monitor for worsening.  Go to the emergency department immediately if you have increased redness, swelling, foul-smelling drainage from the site, or if you develop fever, chills, or other concerns. As discussed, I would like for you to follow-up with wound care if there is delayed healing or if symptoms fail to improve over the next 5 to 7 days..  You will need to call to make an appointment. Follow-up as needed.
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
## 2024-05-16 DIAGNOSIS — Z08 Encounter for follow-up examination after completed treatment for malignant neoplasm: Secondary | ICD-10-CM | POA: Diagnosis not present

## 2024-05-16 DIAGNOSIS — D2261 Melanocytic nevi of right upper limb, including shoulder: Secondary | ICD-10-CM | POA: Diagnosis not present

## 2024-05-16 DIAGNOSIS — Z85820 Personal history of malignant melanoma of skin: Secondary | ICD-10-CM | POA: Diagnosis not present

## 2024-05-16 DIAGNOSIS — Z1283 Encounter for screening for malignant neoplasm of skin: Secondary | ICD-10-CM | POA: Diagnosis not present

## 2024-05-16 DIAGNOSIS — D224 Melanocytic nevi of scalp and neck: Secondary | ICD-10-CM | POA: Diagnosis not present

## 2024-05-16 DIAGNOSIS — D225 Melanocytic nevi of trunk: Secondary | ICD-10-CM | POA: Diagnosis not present

## 2024-05-16 DIAGNOSIS — D485 Neoplasm of uncertain behavior of skin: Secondary | ICD-10-CM | POA: Diagnosis not present

## 2024-05-30 DIAGNOSIS — D485 Neoplasm of uncertain behavior of skin: Secondary | ICD-10-CM | POA: Diagnosis not present

## 2024-05-30 DIAGNOSIS — D2262 Melanocytic nevi of left upper limb, including shoulder: Secondary | ICD-10-CM | POA: Diagnosis not present

## 2024-05-30 DIAGNOSIS — D224 Melanocytic nevi of scalp and neck: Secondary | ICD-10-CM | POA: Diagnosis not present

## 2024-05-30 DIAGNOSIS — Z85820 Personal history of malignant melanoma of skin: Secondary | ICD-10-CM | POA: Diagnosis not present

## 2024-05-30 DIAGNOSIS — Z08 Encounter for follow-up examination after completed treatment for malignant neoplasm: Secondary | ICD-10-CM | POA: Diagnosis not present

## 2024-09-26 DIAGNOSIS — Z0001 Encounter for general adult medical examination with abnormal findings: Secondary | ICD-10-CM | POA: Diagnosis not present

## 2024-09-26 DIAGNOSIS — F17200 Nicotine dependence, unspecified, uncomplicated: Secondary | ICD-10-CM | POA: Diagnosis not present

## 2024-09-26 DIAGNOSIS — F17210 Nicotine dependence, cigarettes, uncomplicated: Secondary | ICD-10-CM | POA: Diagnosis not present

## 2024-09-26 DIAGNOSIS — J209 Acute bronchitis, unspecified: Secondary | ICD-10-CM | POA: Diagnosis not present

## 2024-09-26 DIAGNOSIS — J029 Acute pharyngitis, unspecified: Secondary | ICD-10-CM | POA: Diagnosis not present

## 2024-09-30 ENCOUNTER — Emergency Department (HOSPITAL_COMMUNITY): Admission: EM | Admit: 2024-09-30 | Discharge: 2024-09-30 | Disposition: A | Discharge status: Home / Self Care

## 2024-09-30 ENCOUNTER — Encounter (HOSPITAL_COMMUNITY): Payer: Self-pay

## 2024-09-30 ENCOUNTER — Emergency Department (HOSPITAL_COMMUNITY)

## 2024-09-30 DIAGNOSIS — S61012A Laceration without foreign body of left thumb without damage to nail, initial encounter: Secondary | ICD-10-CM

## 2024-09-30 DIAGNOSIS — S61412A Laceration without foreign body of left hand, initial encounter: Secondary | ICD-10-CM | POA: Diagnosis not present

## 2024-09-30 DIAGNOSIS — W268XXA Contact with other sharp object(s), not elsewhere classified, initial encounter: Secondary | ICD-10-CM | POA: Diagnosis not present

## 2024-09-30 MED ORDER — HYDROCODONE-ACETAMINOPHEN 5-325 MG PO TABS: ORAL_TABLET | ORAL | 0 refills | Status: AC

## 2024-09-30 MED ORDER — LIDOCAINE HCL (PF) 1 % IJ SOLN
10.0000 mL | Freq: Once | INTRAMUSCULAR | Status: AC
  Administered 2024-09-30: 10 mL via INTRADERMAL
  Filled 2024-09-30: qty 10

## 2024-09-30 MED ORDER — CEPHALEXIN 500 MG PO CAPS: 500.0000 mg | ORAL_CAPSULE | Freq: Four times a day (QID) | ORAL | 0 refills | Status: AC

## 2024-09-30 MED ORDER — CEPHALEXIN 500 MG PO CAPS
500.0000 mg | ORAL_CAPSULE | Freq: Once | ORAL | Status: AC
  Administered 2024-09-30: 500 mg via ORAL
  Filled 2024-09-30: qty 1

## 2024-09-30 MED ORDER — POVIDONE-IODINE 10 % EX SOLN
CUTANEOUS | Status: DC | PRN
  Filled 2024-09-30: qty 29.6

## 2024-09-30 NOTE — ED Notes (Signed)
 Pt/family received d/c paperwork at this time. After going over the paperwork any questions, comments, or concerns were answered to the best of this nurse's knowledge. The pt/family verbally acknowledged the teachings/instructions.
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
## 2024-09-30 NOTE — ED Provider Notes (Signed)
 [place] EMERGENCY DEPARTMENT AT [facility] Provider Note   CSN: [id number] Arrival date & time: 09/30/24  1222     Patient presents with: Extremity Laceration (Left hand)   Justin Wolfe is a 38 y.o. male.   HPI     Justin Wolfe is a 38 y.o. male who presents to the Emergency Department for evaluation of laceration to thenar eminence of left thumb.  He states he was using a box cutter to cut a zip tie when he cut his thumb.  He controlled bleeding prior to arrival with direct pressure.  He does not take blood thinners.  He denies any numbness or weakness of his thumb or other fingers.  No wrist pain.  Last Td is believed to be up-to-date  He is right-hand dominant  Prior to Admission medications   Medication Sig Start Date End Date Taking? Authorizing Provider  chlorhexidine  (HIBICLENS ) 4 % external liquid Apply topically daily as needed. Apply a small amount of the solution with warm water and cleanse the affected areas twice daily until symptoms improve. 04/20/24   Leath-Warren, Etta PARAS, NP  ibuprofen  (ADVIL ,MOTRIN ) 800 MG tablet Take 1 tablet (800 mg total) by mouth every 8 (eight) hours as needed. 04/21/18   Long, Joshua G, MD  mupirocin  ointment (BACTROBAN ) 2 % Apply 1 Application topically 2 (two) times daily. 04/20/24   Leath-Warren, Etta PARAS, NP  ondansetron  (ZOFRAN  ODT) 4 MG disintegrating tablet Take 1 tablet (4 mg total) by mouth every 8 (eight) hours as needed for nausea or vomiting. 04/21/18   Long, Fonda MATSU, MD  oxyCODONE -acetaminophen  (PERCOCET/ROXICET) 5-325 MG tablet Take 1 tablet by mouth every 4 (four) hours as needed for severe pain. 04/21/18   Long, Joshua G, MD    Allergies: Patient has no known allergies.    Review of Systems  Skin:  Positive for wound.       Laceration left thumb  All other systems reviewed and are negative.   Updated Vital Signs BP 102/69   Pulse 61   Temp 97.6 F (36.4 C) (Oral)   Resp 18   Ht 5' 8 (1.727  m)   Wt 61.2 kg   SpO2 98%   BMI 20.51 kg/m   Physical Exam Vitals and nursing note reviewed.  Constitutional:      General: He is not in acute distress.    Appearance: Normal appearance. He is not toxic-appearing.  Cardiovascular:     Rate and Rhythm: Normal rate and regular rhythm.     Pulses: Normal pulses.  Pulmonary:     Effort: Pulmonary effort is normal.  Musculoskeletal:        General: Tenderness and signs of injury present. No swelling.     Left hand: Laceration present. No swelling. Normal range of motion. Normal strength. Normal sensation. There is no disruption of two-point discrimination. Normal capillary refill. Normal pulse.     Comments: 3 cm laceration to the thenar eminence of the left thumb.  Bleeding controlled.  Laceration examined through range of motion and entire depth of wound explored.  No foreign bodies.  There appears to be a lengthwise laceration involving tendon versus fascia.  Patient has full range of motion of the thumb and able to perform finger thumb opposition left wrist nontender sensation distal to the laceration is intact  See attached photo  Skin:    General: Skin is warm.     Capillary Refill: Capillary refill takes less than 2 seconds.  Neurological:     General: No focal deficit present.     Mental Status: He is alert.     Sensory: No sensory deficit.     Motor: No weakness.     (all labs ordered are listed, but only abnormal results are displayed) Labs Reviewed - No data to display  EKG: None  Radiology: DG Hand Complete Left Result Date: 09/30/2024 CLINICAL DATA:  laceration EXAM: LEFT HAND - COMPLETE 3+ VIEW COMPARISON:  None Available. FINDINGS: No acute fracture or dislocation. There is no evidence of arthropathy or other focal bone abnormality. Soft tissues are unremarkable. No radiopaque foreign body. IMPRESSION: No acute fracture or dislocation.  No radiopaque foreign body. Electronically Signed   By: Rogelia Myers M.D.    On: 09/30/2024 14:41     .Laceration Repair  Date/Time: 09/30/2024 3:03 PM  Performed by: Herlinda Milling, PA-C Authorized by: Herlinda Milling, PA-C   Consent:    Consent obtained:  Verbal   Consent given by:  Patient   Risks, benefits, and alternatives were discussed: yes   Universal protocol:    Procedure explained and questions answered to patient or proxy's satisfaction: yes     Test results available: yes     Imaging studies available: yes     Immediately prior to procedure, a time out was called: yes     Patient identity confirmed:  Verbally with patient and provided demographic data Anesthesia:    Anesthesia method:  Local infiltration   Local anesthetic:  Lidocaine  1% w/o epi Laceration details:    Location:  Finger   Finger location:  L thumb   Length (cm):  3 Pre-procedure details:    Preparation:  Patient was prepped and draped in usual sterile fashion and imaging obtained to evaluate for foreign bodies Exploration:    Limited defect created (wound extended): no     Hemostasis obtained with: Hemostasis was obtained prior to wound closure by patient applying direct pressure.   Imaging obtained: x-ray     Imaging outcome: foreign body not noted     Wound exploration: wound explored through full range of motion and entire depth of wound visualized     Wound extent: muscle damage     Contaminated: no   Treatment:    Area cleansed with:  Saline and povidone-iodine    Amount of cleaning:  Standard   Irrigation solution:  Sterile saline   Irrigation method:  Syringe   Debridement:  None   Undermining:  None   Layers/structures repaired:  Muscle belly Muscle belly:    Suture size:  5-0   Suture material:  Vicryl   Suture technique:  Simple interrupted   Number of sutures:  4 Skin repair:    Repair method:  Sutures   Suture size:  4-0   Suture material:  Prolene   Suture technique:  Simple interrupted   Number of sutures:  9 Approximation:    Approximation:   Close Repair type:    Repair type:  Complex Post-procedure details:    Dressing:  Bulky dressing and non-adherent dressing   Procedure completion:  Tolerated well, no immediate complications Comments:     Possible lengthwise injury of the tendon, but patient has full range of motion of the left thumb.  Distal sensation  intact.      Medications Ordered in the ED  povidone-iodine  (BETADINE ) 10 % external solution (has no administration in time range)  lidocaine  (PF) (XYLOCAINE ) 1 % injection 10 mL (has no administration  in time range)                                    Medical Decision Making   Patient here for laceration to thenar eminence of left thumb.  He is right-hand dominant.  Last Td is unknown.  Laceration occurred from a box cutter knife.  Hemostasis was obtained prior to ER arrival using direct pressure.  On exam, patient has full range of motion of the thumb of the left hand.  He is neurovascularly intact.  There appears to be a visible injury of  muscle and fascia versus possible tendon sheath.  I feel this is likely fascia given patient has reassuring range of motion   Medical records reviewed, last Tdap 2023  Amount and/or Complexity of Data Reviewed Radiology: ordered.    Details: X-ray negative for bony injury or metallic foreign body Discussion of management or test interpretation with external provider(s):   Patient also seen Dr. Norlin and care plan discussed.  Possible lengthwise injury to the likely fascia versus tendon sheath of the left thumb extremity remains neurovascularly intact.  He still has full range of motion this may also be fasciitis unclear.  Wound was thoroughly explored through range of motion and entire depth of wound.  Muscle belly closed with Vicryl and skin closed with Prolene.  Patient tolerated well.  Bulky dressing applied and he will follow-up closely outpatient with orthopedics.  Will start abx. Prefers local orthopedics.  Wound care  instructions, sutures out in 8-10 days  Risk OTC drugs. Prescription drug management.        Final diagnoses:  Laceration of left thumb without foreign body without damage to nail, initial encounter    ED Discharge Orders     None          Herlinda Milling, PA-C 10/02/24 1516    Gennaro Bouchard L, DO 10/04/24 1757

## 2024-09-30 NOTE — Discharge Instructions (Addendum)
 Keep the wound clean with mild soap and water and keep it bandaged.  Avoid excessive use of your left hand.  Elevate when possible to help with swelling.  You may also take ibuprofen  if needed.  You may have a tendon injury to your thumb, as we discussed please call the orthopedic provider listed to arrange follow-up appointment.  Return to the emergency department for any new or worsening symptoms or signs of infection.  Your sutures will need to be removed in 8 to 10 days.

## 2024-10-10 ENCOUNTER — Emergency Department (HOSPITAL_COMMUNITY)
Admission: EM | Admit: 2024-10-10 | Discharge: 2024-10-10 | Disposition: A | Discharge status: Home / Self Care | Attending: Emergency Medicine | Admitting: Emergency Medicine

## 2024-10-10 ENCOUNTER — Other Ambulatory Visit: Payer: Self-pay

## 2024-10-10 ENCOUNTER — Encounter (HOSPITAL_COMMUNITY): Payer: Self-pay

## 2024-10-10 DIAGNOSIS — Z4802 Encounter for removal of sutures: Secondary | ICD-10-CM

## 2024-10-10 NOTE — Discharge Instructions (Signed)
 You are seen for suture removal, replace Steri-Strips which will peel off on their own, keep this area clean and dry, develop pain, swelling redness or any other worsening symptoms come back to the ER.  You can apply vitamin E oil topically once the Steri-Strips peel off today but the scar.  Continue to be careful with that hand for the next week or 2.
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
## 2024-10-10 NOTE — ED Triage Notes (Signed)
 Patietn came in POV from home to get sutures removed on left hand that were placed last Friday.
